# Patient Record
Sex: Male | Born: 1965 | Race: Black or African American | Hispanic: No | Marital: Single | Smoking: Current some day smoker
Health system: Southern US, Community
[De-identification: ages and names within clinical notes are randomized; demographics above are authoritative.]

## PROBLEM LIST (undated history)

## (undated) DIAGNOSIS — S31109A Unspecified open wound of abdominal wall, unspecified quadrant without penetration into peritoneal cavity, initial encounter: Secondary | ICD-10-CM

## (undated) HISTORY — PX: TIBIA FRACTURE SURGERY: SHX806

---

## 2017-11-28 ENCOUNTER — Other Ambulatory Visit: Payer: Self-pay

## 2017-11-28 ENCOUNTER — Emergency Department (HOSPITAL_BASED_OUTPATIENT_CLINIC_OR_DEPARTMENT_OTHER)
Admission: EM | Admit: 2017-11-28 | Discharge: 2017-11-28 | Disposition: A | Discharge status: Home / Self Care | Payer: Self-pay | Attending: Emergency Medicine | Admitting: Emergency Medicine

## 2017-11-28 ENCOUNTER — Emergency Department (HOSPITAL_BASED_OUTPATIENT_CLINIC_OR_DEPARTMENT_OTHER): Payer: Self-pay

## 2017-11-28 ENCOUNTER — Encounter (HOSPITAL_BASED_OUTPATIENT_CLINIC_OR_DEPARTMENT_OTHER): Payer: Self-pay | Admitting: *Deleted

## 2017-11-28 DIAGNOSIS — F10920 Alcohol use, unspecified with intoxication, uncomplicated: Secondary | ICD-10-CM | POA: Insufficient documentation

## 2017-11-28 DIAGNOSIS — Z5329 Procedure and treatment not carried out because of patient's decision for other reasons: Secondary | ICD-10-CM | POA: Insufficient documentation

## 2017-11-28 DIAGNOSIS — R2241 Localized swelling, mass and lump, right lower limb: Secondary | ICD-10-CM | POA: Insufficient documentation

## 2017-11-28 DIAGNOSIS — M79604 Pain in right leg: Secondary | ICD-10-CM | POA: Insufficient documentation

## 2017-11-28 DIAGNOSIS — F17200 Nicotine dependence, unspecified, uncomplicated: Secondary | ICD-10-CM | POA: Insufficient documentation

## 2017-11-28 HISTORY — DX: Unspecified open wound of abdominal wall, unspecified quadrant without penetration into peritoneal cavity, initial encounter: S31.109A

## 2017-11-28 LAB — ETHANOL: Alcohol, Ethyl (B): 101 mg/dL — ABNORMAL HIGH (ref <10)

## 2017-11-28 LAB — D-DIMER, QUANTITATIVE: D-Dimer, Quant: 0.46 ug/mL-FEU (ref 0.00–0.50)

## 2017-11-28 NOTE — ED Notes (Signed)
ED Provider at bedside. 

## 2017-11-28 NOTE — ED Triage Notes (Signed)
Right leg pain x 3 hours PTA while at work. Swelling noted. Pt had previous injury to leg. Denies acute injury
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
## 2017-11-28 NOTE — ED Notes (Signed)
Went in to round on patient. Found pt to be gone and gown on the bed.

## 2017-11-28 NOTE — ED Notes (Signed)
Patient transported to Ultrasound 

## 2017-12-06 ENCOUNTER — Emergency Department (HOSPITAL_BASED_OUTPATIENT_CLINIC_OR_DEPARTMENT_OTHER)
Admission: EM | Admit: 2017-12-06 | Discharge: 2017-12-06 | Disposition: A | Discharge status: Home / Self Care | Payer: Medicaid Other | Attending: Emergency Medicine | Admitting: Emergency Medicine

## 2017-12-06 ENCOUNTER — Encounter (HOSPITAL_BASED_OUTPATIENT_CLINIC_OR_DEPARTMENT_OTHER): Payer: Self-pay | Admitting: *Deleted

## 2017-12-06 ENCOUNTER — Other Ambulatory Visit: Payer: Self-pay

## 2017-12-06 DIAGNOSIS — F17210 Nicotine dependence, cigarettes, uncomplicated: Secondary | ICD-10-CM | POA: Insufficient documentation

## 2017-12-06 DIAGNOSIS — L237 Allergic contact dermatitis due to plants, except food: Secondary | ICD-10-CM

## 2017-12-06 MED ORDER — PREDNISONE 20 MG PO TABS: ORAL_TABLET | ORAL | 0 refills | Status: AC

## 2017-12-06 MED ORDER — TRIAMCINOLONE ACETONIDE 0.1 % EX CREA: 1.0000 "application " | TOPICAL_CREAM | Freq: Two times a day (BID) | CUTANEOUS | 0 refills | Status: AC

## 2017-12-06 NOTE — ED Triage Notes (Signed)
Rash to face and arms. Has been working outside cutting down trees. ?poison ivy
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
## 2017-12-07 MED FILL — TRIAMCINOLONE 0.1% CREAM: 0.1 | 20 days supply | Qty: 30 | Fill #0

## 2017-12-07 MED FILL — predniSONE 20 MG TABS: 20 | 5 days supply | Qty: 11 | Fill #0

## 2017-12-08 ENCOUNTER — Emergency Department (HOSPITAL_COMMUNITY): Payer: Self-pay

## 2017-12-08 ENCOUNTER — Emergency Department (HOSPITAL_COMMUNITY)
Admission: EM | Admit: 2017-12-08 | Discharge: 2017-12-09 | Disposition: A | Discharge status: Home / Self Care | Payer: Self-pay | Attending: Emergency Medicine | Admitting: Emergency Medicine

## 2017-12-08 DIAGNOSIS — F17210 Nicotine dependence, cigarettes, uncomplicated: Secondary | ICD-10-CM | POA: Insufficient documentation

## 2017-12-08 DIAGNOSIS — Y9389 Activity, other specified: Secondary | ICD-10-CM | POA: Insufficient documentation

## 2017-12-08 DIAGNOSIS — Y999 Unspecified external cause status: Secondary | ICD-10-CM | POA: Insufficient documentation

## 2017-12-08 DIAGNOSIS — Y929 Unspecified place or not applicable: Secondary | ICD-10-CM | POA: Insufficient documentation

## 2017-12-08 DIAGNOSIS — R52 Pain, unspecified: Secondary | ICD-10-CM

## 2017-12-08 DIAGNOSIS — M79604 Pain in right leg: Secondary | ICD-10-CM | POA: Insufficient documentation

## 2017-12-08 DIAGNOSIS — W1830XA Fall on same level, unspecified, initial encounter: Secondary | ICD-10-CM | POA: Insufficient documentation

## 2017-12-08 NOTE — ED Triage Notes (Signed)
Pt reports 10/10 right leg pain after "slipping" off a log while at work today. Pt reports prior surgery to his right leg. No obvious deformity noted and sensation intact. Pt A+OX, NAD.

## 2017-12-09 MED ORDER — NAPROXEN 500 MG PO TABS
500.0000 mg | ORAL_TABLET | Freq: Once | ORAL | Status: AC
  Administered 2017-12-09: 500 mg via ORAL
  Filled 2017-12-09: qty 1

## 2017-12-09 MED ORDER — DICLOFENAC SODIUM 50 MG PO TBEC: 50.0000 mg | DELAYED_RELEASE_TABLET | Freq: Two times a day (BID) | ORAL | 0 refills | Status: AC

## 2017-12-09 MED ORDER — OXYCODONE-ACETAMINOPHEN 5-325 MG PO TABS
1.0000 | ORAL_TABLET | Freq: Once | ORAL | Status: AC
  Administered 2017-12-09: 1 via ORAL
  Filled 2017-12-09: qty 1

## 2017-12-09 NOTE — Discharge Instructions (Signed)
Your x-rays did not show any new broken bones.  We recommend that you ice the areas of pain and injury to limit swelling.  Do this 3-4 times per day.  You have been prescribed Voltaren for pain control.  Follow-up with your orthopedist for further evaluation of your symptoms.
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
## 2019-04-24 IMAGING — DX DG TIBIA/FIBULA 2V*R*
3 series · 3 of 3 positions shown · non-contrast
Comparison: None.

CLINICAL DATA: 51-year-old male with right leg pain.

EXAM:
RIGHT TIBIA AND FIBULA - 2 VIEW

[tibia ap]
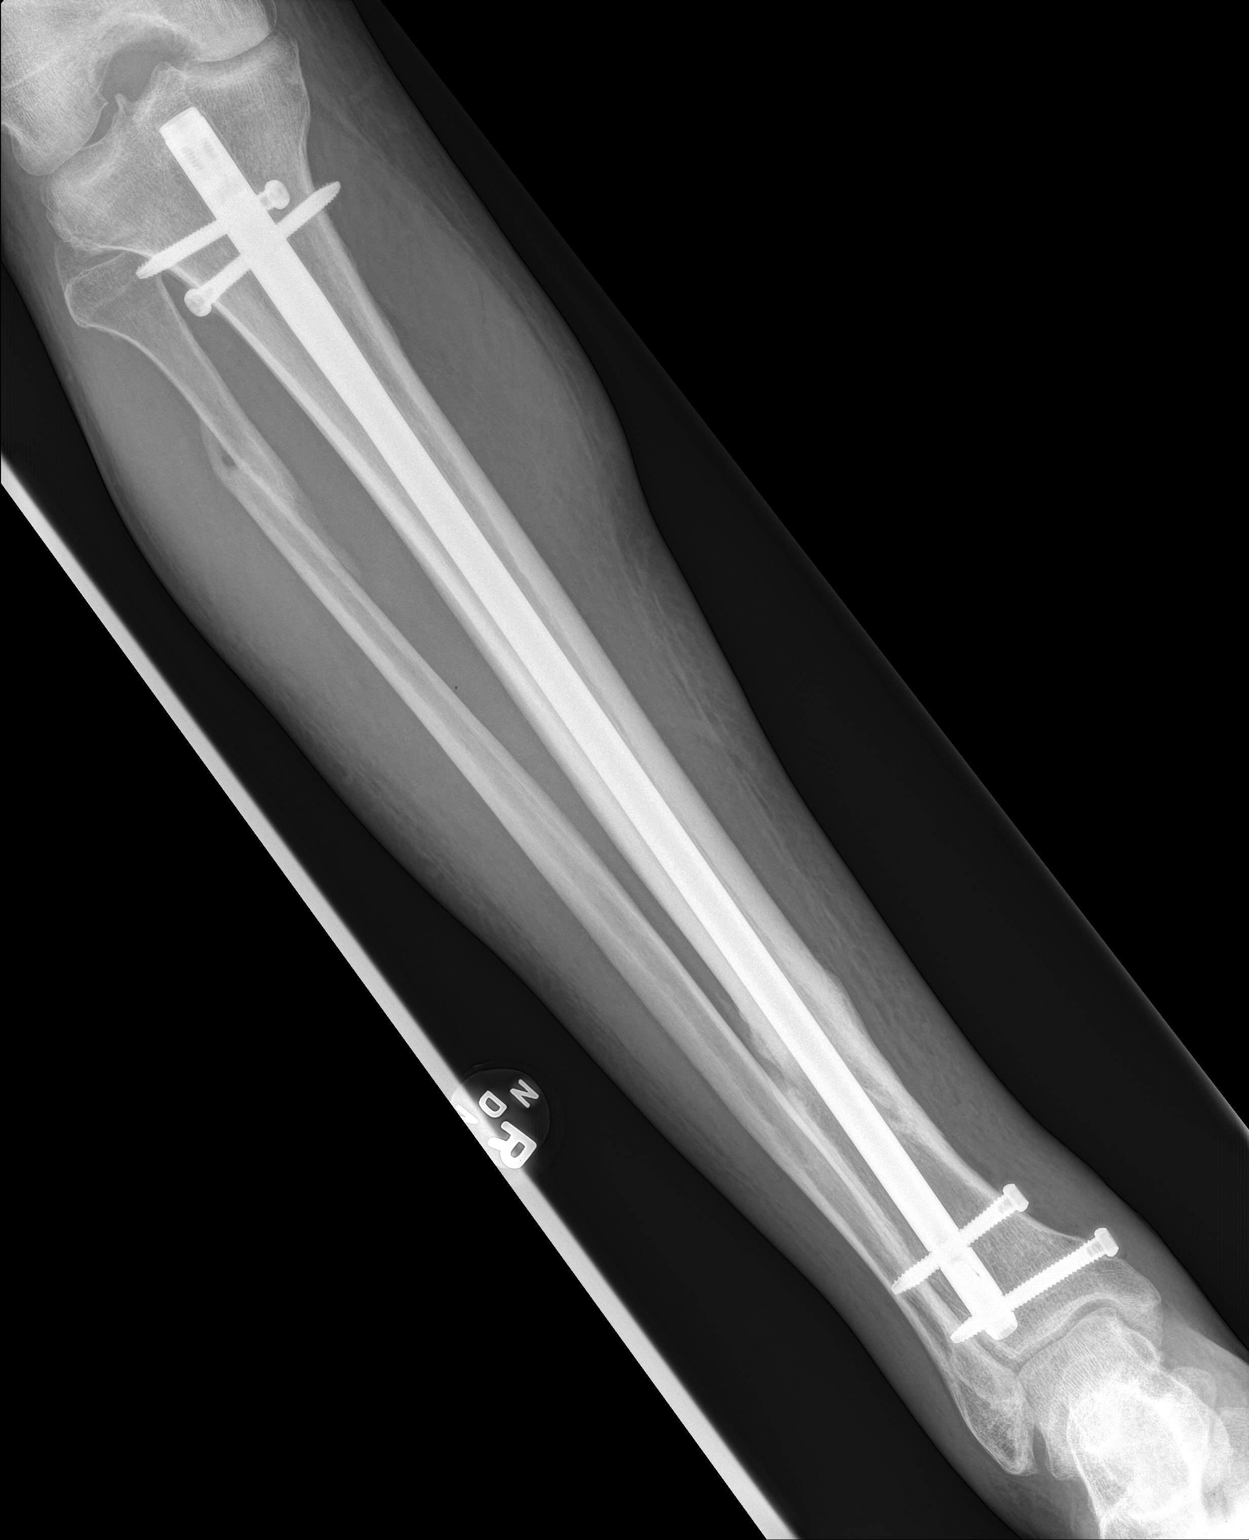

[tibia lat (1 of 2)]
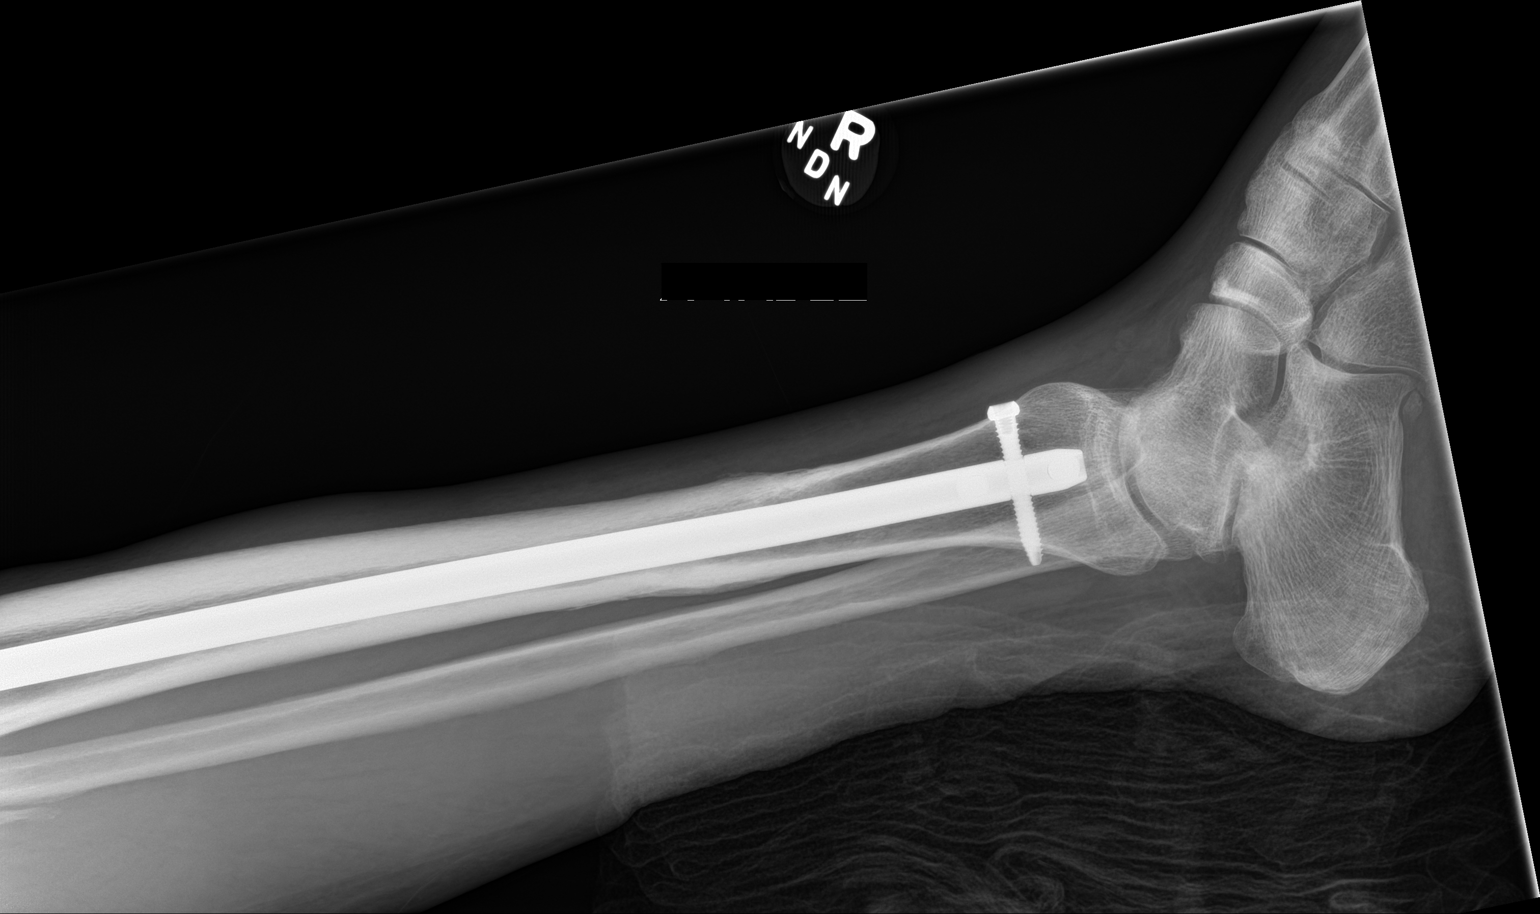

[tibia lat (2 of 2)]
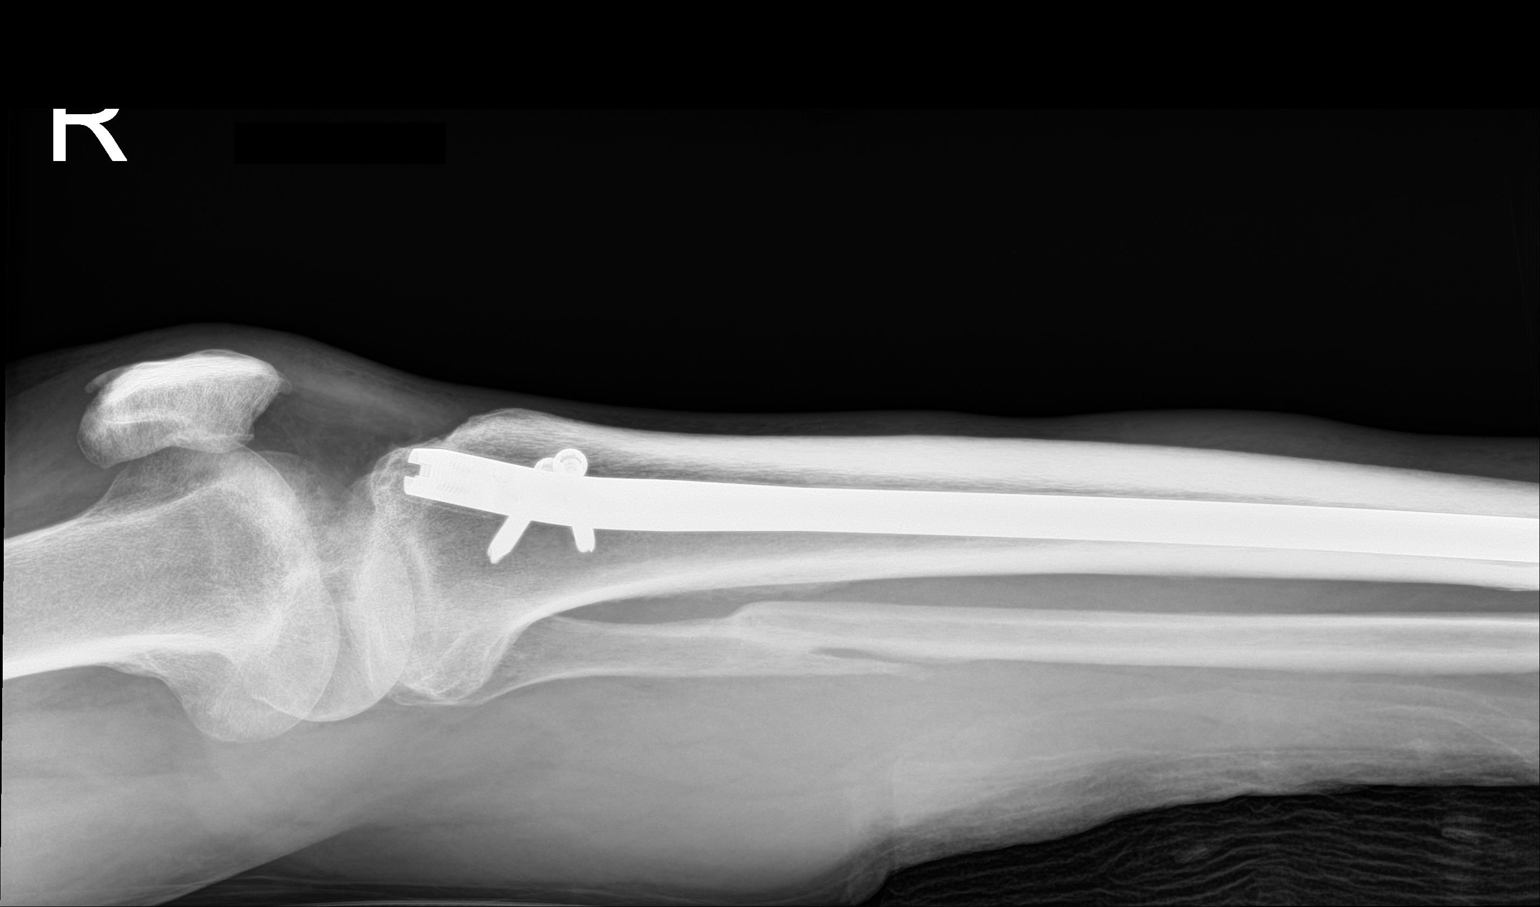

[3 of 3 positions shown; findings below may reference images not displayed]

FINDINGS: There is no acute fracture or dislocation. Old healed proximal
fibular and distal tibial fracture deformities. Tibial
intramedullary rod noted. The bones are osteopenic for the patient's
age. Mild diffuse subcutaneous edema.
IMPRESSION: No acute fracture or dislocation.

## 2019-05-04 IMAGING — CR DG FEMUR 2+V*R*
4 series · 4 of 4 positions shown · non-contrast
Comparison: None.

CLINICAL DATA: Right leg injury when the patient slipped off a log
today. History of prior fracture. Initial encounter.

EXAM:
RIGHT FEMUR 2 VIEWS

[t femur distal ap right]
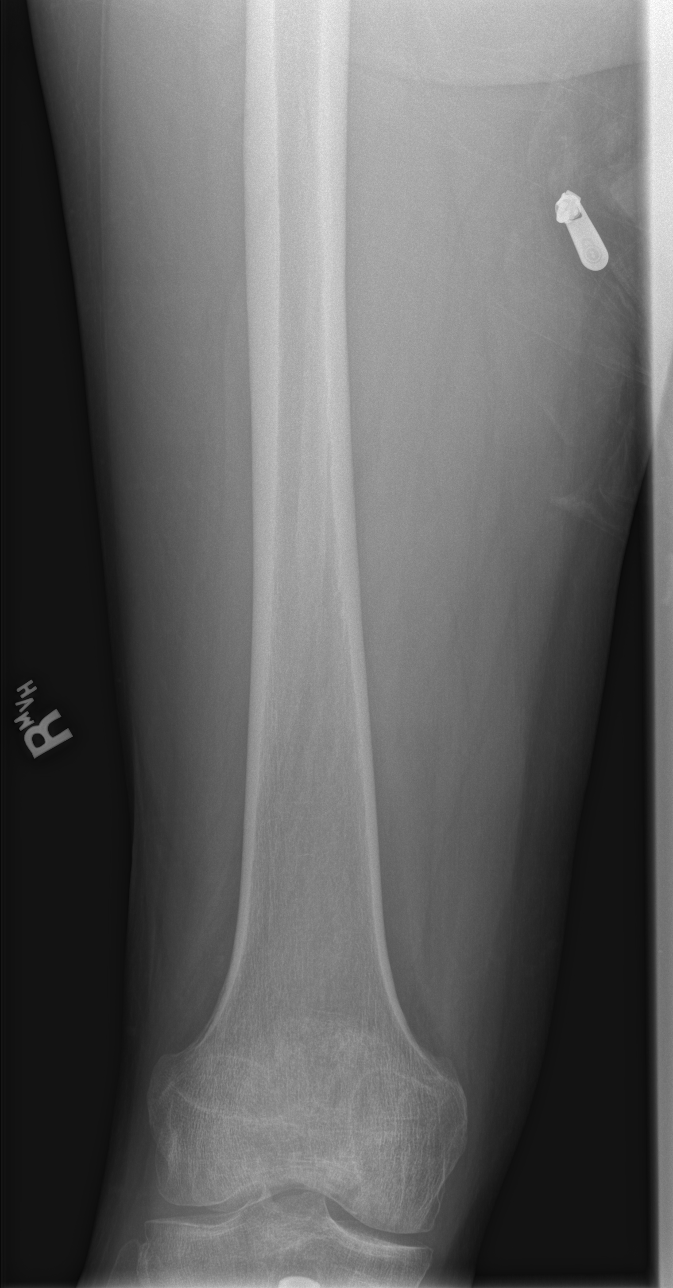

[t femur proximal ap right]
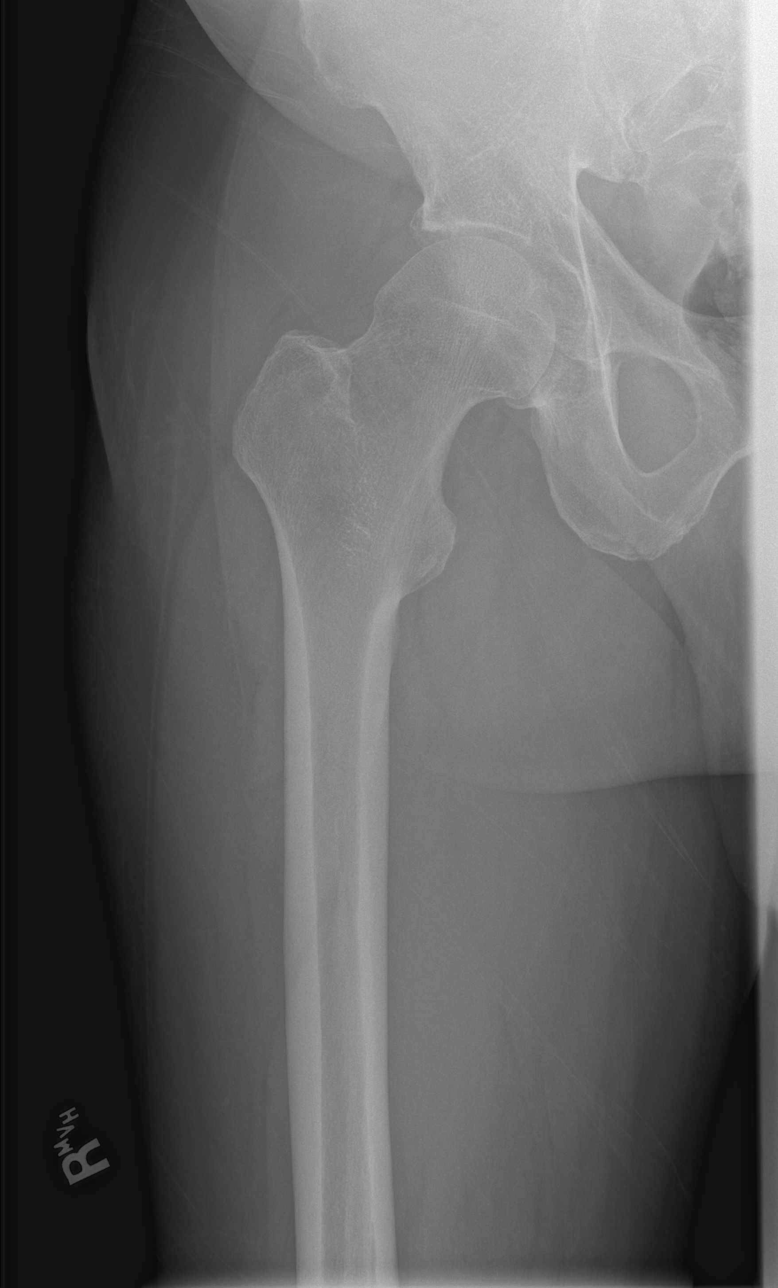

[t femur proximal lat right]
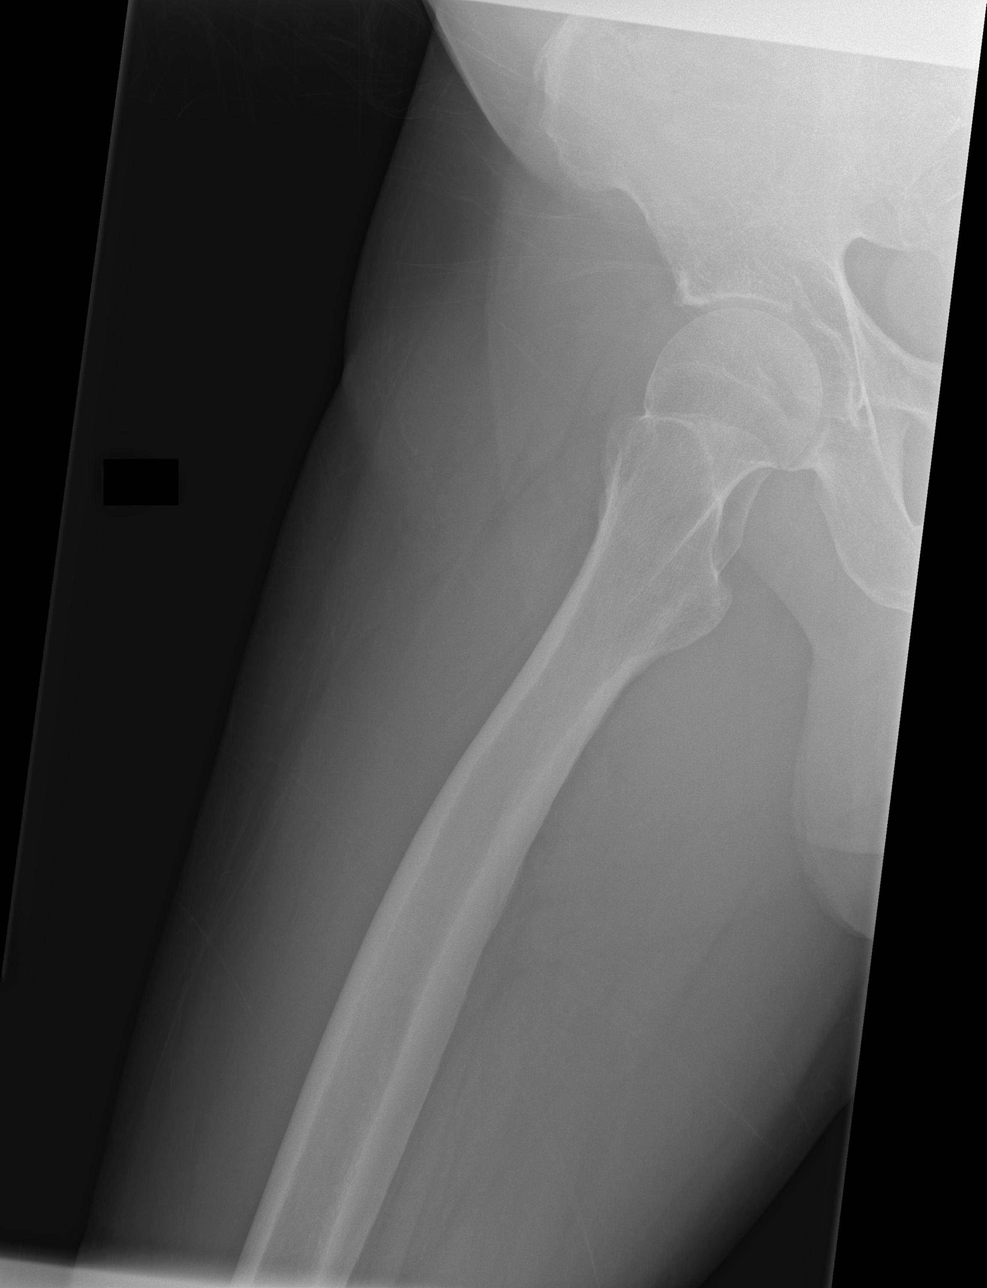

[t femur distal lat right]
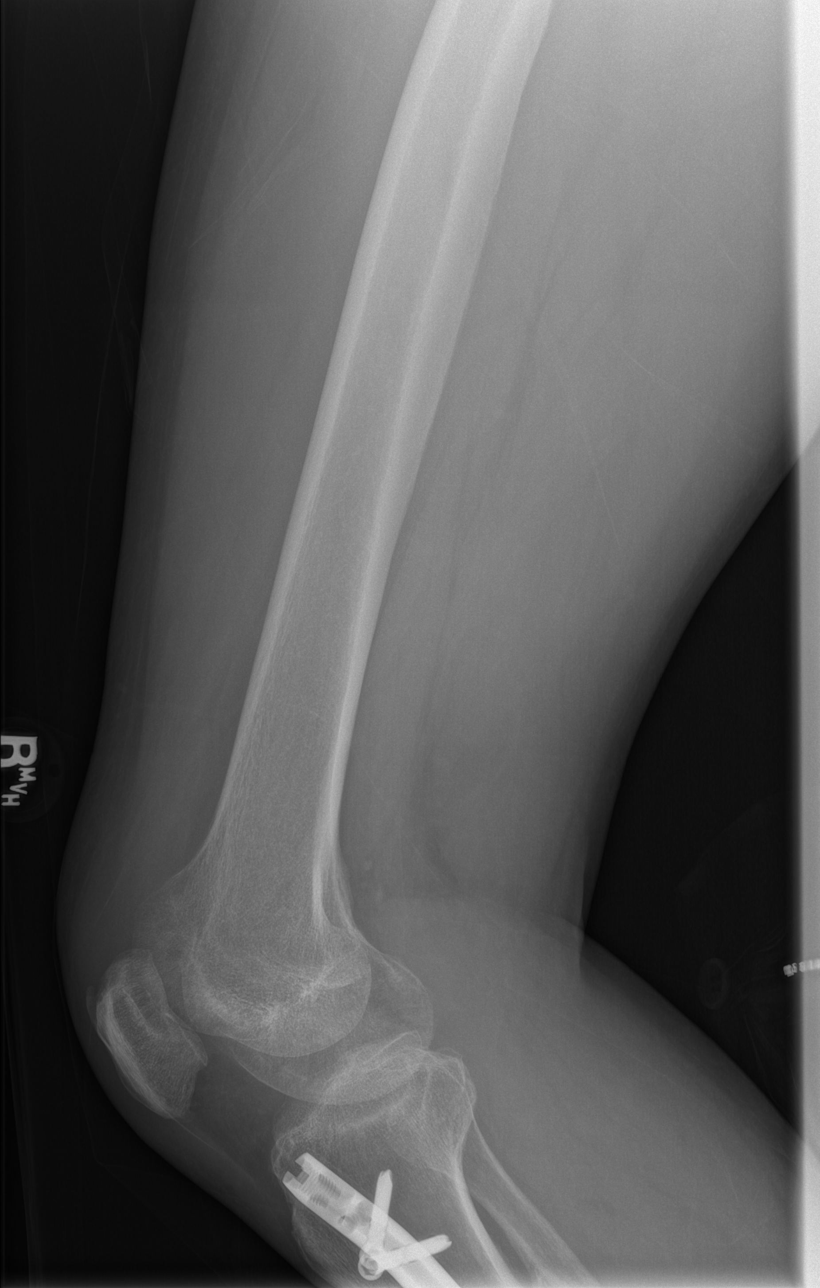

[4 of 4 positions shown; findings below may reference images not displayed]

FINDINGS: No acute bony or joint abnormality is identified. Mild-to-moderate
degenerative change is seen about the right hip. Soft tissues are
unremarkable.
IMPRESSION: No acute abnormality.
